# Patient Record
Sex: Female | Born: 2000
Health system: Southern US, Community
[De-identification: ages and names within clinical notes are randomized; demographics above are authoritative.]

## PROBLEM LIST (undated history)

## (undated) DIAGNOSIS — T7840XA Allergy, unspecified, initial encounter: Secondary | ICD-10-CM

## (undated) HISTORY — DX: Allergy, unspecified, initial encounter: T78.40XA

## (undated) HISTORY — PX: TONSILLECTOMY: SUR1361

---
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
## 2022-01-29 DIAGNOSIS — Z309 Encounter for contraceptive management, unspecified: Secondary | ICD-10-CM | POA: Diagnosis not present

## 2022-07-20 DIAGNOSIS — Z3009 Encounter for other general counseling and advice on contraception: Secondary | ICD-10-CM | POA: Diagnosis not present

## 2022-11-04 DIAGNOSIS — Z118 Encounter for screening for other infectious and parasitic diseases: Secondary | ICD-10-CM | POA: Diagnosis not present

## 2022-11-04 DIAGNOSIS — Z01419 Encounter for gynecological examination (general) (routine) without abnormal findings: Secondary | ICD-10-CM | POA: Diagnosis not present

## 2023-05-23 ENCOUNTER — Other Ambulatory Visit: Payer: Self-pay

## 2023-05-23 ENCOUNTER — Emergency Department (HOSPITAL_BASED_OUTPATIENT_CLINIC_OR_DEPARTMENT_OTHER)
Admission: EM | Admit: 2023-05-23 | Discharge: 2023-05-23 | Disposition: A | Discharge status: Home / Self Care | Payer: Federal, State, Local not specified - PPO

## 2023-05-23 DIAGNOSIS — R5383 Other fatigue: Secondary | ICD-10-CM | POA: Insufficient documentation

## 2023-05-23 DIAGNOSIS — M79605 Pain in left leg: Secondary | ICD-10-CM | POA: Diagnosis not present

## 2023-05-23 DIAGNOSIS — M79604 Pain in right leg: Secondary | ICD-10-CM | POA: Diagnosis not present

## 2023-05-23 DIAGNOSIS — M79661 Pain in right lower leg: Secondary | ICD-10-CM | POA: Insufficient documentation

## 2023-05-23 DIAGNOSIS — M79662 Pain in left lower leg: Secondary | ICD-10-CM | POA: Insufficient documentation

## 2023-05-23 LAB — CBC WITH DIFFERENTIAL/PLATELET
Abs Immature Granulocytes: 0.03 10*3/uL (ref 0.00–0.07)
Basophils Absolute: 0 10*3/uL (ref 0.0–0.1)
Basophils Relative: 0 %
Eosinophils Absolute: 0.1 10*3/uL (ref 0.0–0.5)
Eosinophils Relative: 1 %
HCT: 37.8 % (ref 36.0–46.0)
Hemoglobin: 12.8 g/dL (ref 12.0–15.0)
Immature Granulocytes: 0 %
Lymphocytes Relative: 32 %
Lymphs Abs: 2.7 10*3/uL (ref 0.7–4.0)
MCH: 30.3 pg (ref 26.0–34.0)
MCHC: 33.9 g/dL (ref 30.0–36.0)
MCV: 89.4 fL (ref 80.0–100.0)
Monocytes Absolute: 0.6 10*3/uL (ref 0.1–1.0)
Monocytes Relative: 7 %
Neutro Abs: 5.1 10*3/uL (ref 1.7–7.7)
Neutrophils Relative %: 60 %
Platelets: 297 10*3/uL (ref 150–400)
RBC: 4.23 MIL/uL (ref 3.87–5.11)
RDW: 11.9 % (ref 11.5–15.5)
WBC: 8.5 10*3/uL (ref 4.0–10.5)
nRBC: 0 % (ref 0.0–0.2)

## 2023-05-23 LAB — COMPREHENSIVE METABOLIC PANEL
ALT: 7 U/L (ref 0–44)
AST: 14 U/L — ABNORMAL LOW (ref 15–41)
Albumin: 4.3 g/dL (ref 3.5–5.0)
Alkaline Phosphatase: 50 U/L (ref 38–126)
Anion gap: 7 (ref 5–15)
BUN: 9 mg/dL (ref 6–20)
CO2: 28 mmol/L (ref 22–32)
Calcium: 9.7 mg/dL (ref 8.9–10.3)
Chloride: 103 mmol/L (ref 98–111)
Creatinine, Ser: 0.67 mg/dL (ref 0.44–1.00)
GFR, Estimated: >60 mL/min (ref >60)
Glucose, Bld: 86 mg/dL (ref 70–99)
Potassium: 3.7 mmol/L (ref 3.5–5.1)
Sodium: 138 mmol/L (ref 135–145)
Total Bilirubin: 0.5 mg/dL (ref <1.2)
Total Protein: 7 g/dL (ref 6.5–8.1)

## 2023-05-23 MED ORDER — PREDNISONE 10 MG (21) PO TBPK: ORAL_TABLET | Freq: Every day | ORAL | 0 refills | Status: DC

## 2023-05-23 MED ORDER — ACETAMINOPHEN 500 MG PO TABS
1000.0000 mg | ORAL_TABLET | Freq: Once | ORAL | Status: AC
  Administered 2023-05-23: 1000 mg via ORAL
  Filled 2023-05-23: qty 2

## 2023-05-23 MED ORDER — IBUPROFEN 400 MG PO TABS
400.0000 mg | ORAL_TABLET | Freq: Once | ORAL | Status: AC
  Administered 2023-05-23: 400 mg via ORAL
  Filled 2023-05-23: qty 1

## 2023-05-23 NOTE — ED Triage Notes (Signed)
C/o bilateral leg pain. States she has knots and bruises on both legs x 1 weeks. Areas are red and swollen.

## 2023-05-23 NOTE — Discharge Instructions (Addendum)
Your labs were normal. Take steroids that were prescribed you.  Please follow-up with your primary doctor and see a rheumatologist.  May take over-the-counter Tylenol alternate with Motrin for pain.

## 2023-05-23 NOTE — ED Notes (Signed)
Pt requesting to speak with provider, EDP notified

## 2023-05-23 NOTE — ED Provider Notes (Signed)
[place] EMERGENCY DEPARTMENT AT [facility] Provider Note   CSN: [id number] Arrival date & time: 05/23/23  1334     History  Chief Complaint  Patient presents with   Leg Pain    [name] [name] is a 22 y.o. female.  This is a 22 year old female presenting emergency department for bilateral lumps and painful areas on her legs for the past week.  No inciting event or trauma.  She states areas seemingly popped up and are painful to touch.  Has some pain with walking.  No fevers, weight loss or weight gain.  No generalized weakness.  Does endorse some fatigue.  Denies any URI symptoms.  No chest pain no abdominal pain.  No UTIs, normal bowel movements.   Leg Pain      Home Medications Prior to Admission medications   Medication Sig Start Date End Date Taking? Authorizing Provider  albuterol (VENTOLIN HFA) 108 (90 Base) MCG/ACT inhaler Inhale into the lungs. 02/29/20   [provider]  fluticasone Aleda Grana) 50 MCG/ACT nasal spray  10/31/12   [provider]  loratadine (CLARITIN) 10 MG tablet Take 10 mg by mouth daily.    [provider]  methocarbamol (ROBAXIN) 500 MG tablet Take 1 tablet (500 mg total) by mouth every 8 (eight) hours as needed for muscle spasms. 04/24/21   Benjiman Core, MD      Allergies    Prunus persica    Review of Systems   Review of Systems  Physical Exam Updated Vital Signs BP 115/76   Pulse 66   Temp 98 F (36.7 C) (Oral)   Resp 18   Ht 5\' 2"  (1.575 m)   Wt 56.7 kg   SpO2 100%   BMI 22.86 kg/m  Physical Exam Vitals and nursing note reviewed.  Constitutional:      General: She is not in acute distress.    Appearance: She is not toxic-appearing.  HENT:     Head: Normocephalic.     Nose: Nose normal.     Mouth/Throat:     Mouth: Mucous membranes are moist.  Eyes:     Conjunctiva/sclera: Conjunctivae normal.  Cardiovascular:     Rate and Rhythm: Normal rate.     Pulses: Normal pulses.   Pulmonary:     Effort: Pulmonary effort is normal.  Abdominal:     General: Abdomen is flat. There is no distension.     Tenderness: There is no abdominal tenderness. There is no guarding or rebound.  Musculoskeletal:     Comments: Bilateral lower extremities warm well-perfused.  2+ DP pulse.  Soft compartments.  Has several areas of bruising and tenderness.  To bilateral anterior shins that are tender to touch, some mild redness but not warm/erythematous/fluctance.   Skin:    Capillary Refill: Capillary refill takes less than 2 seconds.  Neurological:     Mental Status: She is alert and oriented to person, place, and time.  Psychiatric:        Mood and Affect: Mood normal.        Behavior: Behavior normal.     ED Results / Procedures / Treatments   Labs (all labs ordered are listed, but only abnormal results are displayed) Labs Reviewed  COMPREHENSIVE METABOLIC PANEL - Abnormal; Notable for the following components:      Result Value   AST 14 (*)    All other components within normal limits  CBC WITH DIFFERENTIAL/PLATELET    EKG None  Radiology No results found.  Procedures Procedures    Medications Ordered in ED Medications  acetaminophen (TYLENOL) tablet 1,000 mg (1,000 mg Oral Given 05/23/23 1854)  ibuprofen (ADVIL) tablet 400 mg (400 mg Oral Given 05/23/23 1854)    ED Course/ Medical Decision Making/ A&P                                 Medical Decision Making Well-appearing 23 year old female present emergency department with bilateral leg pains.  Chest afebrile vital signs reassuring.  No constitutional symptoms.  CBC with no leukocytosis to suggest systemic infection.  No leukocytosis to suggest neoplastic process.  CMP with no electrolyte disturbances.  Normal renal function.  No transaminitis to suggest hepatitis/hepatobiliary disease.  Considered imaging of the legs, however exam and history not consistent with DVT.  Otherwise low risk.  Forego ultrasound.   Concern for possible autoimmune component given family history provided by mother of lupus.  Will give burst of steroids.  Given Tylenol Motrin here in the emergency department.  Stable for discharge to follow-up with PCP/rheumatology.  Amount and/or Complexity of Data Reviewed Labs: ordered.  Risk OTC drugs. Prescription drug management.          Final Clinical Impression(s) / ED Diagnoses Final diagnoses:  None    Rx / DC Orders ED Discharge Orders     None         Coral Spikes, DO 05/23/23 1942

## 2023-05-25 ENCOUNTER — Encounter: Payer: Self-pay | Admitting: Nurse Practitioner

## 2023-05-25 ENCOUNTER — Ambulatory Visit: Payer: Federal, State, Local not specified - PPO | Admitting: Nurse Practitioner

## 2023-05-25 VITALS — BP 80/60 | HR 78 | Temp 98.6°F | Ht 62.0 in | Wt 126.6 lb

## 2023-05-25 DIAGNOSIS — Z113 Encounter for screening for infections with a predominantly sexual mode of transmission: Secondary | ICD-10-CM | POA: Diagnosis not present

## 2023-05-25 DIAGNOSIS — R21 Rash and other nonspecific skin eruption: Secondary | ICD-10-CM

## 2023-05-25 DIAGNOSIS — R229 Localized swelling, mass and lump, unspecified: Secondary | ICD-10-CM | POA: Diagnosis not present

## 2023-05-25 DIAGNOSIS — Z2821 Immunization not carried out because of patient refusal: Secondary | ICD-10-CM

## 2023-05-25 DIAGNOSIS — Z7689 Persons encountering health services in other specified circumstances: Secondary | ICD-10-CM

## 2023-05-25 DIAGNOSIS — Z114 Encounter for screening for human immunodeficiency virus [HIV]: Secondary | ICD-10-CM | POA: Diagnosis not present

## 2023-05-25 DIAGNOSIS — T148XXA Other injury of unspecified body region, initial encounter: Secondary | ICD-10-CM

## 2023-05-25 DIAGNOSIS — Z1159 Encounter for screening for other viral diseases: Secondary | ICD-10-CM

## 2023-05-25 NOTE — Patient Instructions (Signed)
If you are not feeling better on Monday return call to office for antibiotics.  Be sure to take prednisone until completely gone

## 2023-05-25 NOTE — Progress Notes (Signed)
Madelaine Bhat, CMA,acting as a Neurosurgeon for Arnette Felts, FNP.,have documented all relevant documentation on the behalf of Arnette Felts, FNP,as directed by  Arnette Felts, FNP while in the presence of Arnette Felts, FNP.  Subjective:  Patient ID: [name] [name] , female    DOB: 02/05/2001 , 22 y.o.   MRN: [id number]  Chief Complaint  Patient presents with   Establish Care    HPI  Patient presents today to establish care, she was seeing Dr Marcial Pacas at [facility]. Patient reports compliance with medication. Patient denies any chest pain, SOB, or headaches. Patient reports she has knots and bruises on her lower legs. Patient reports they first started a few weeks ago, she reports they are very painful. She reports she notices her ankle are sometimes swollen as well, she reports them she gets up in the morning her legs hurt to walk. She did go to ER on 05/23/2023 for her legs and they advised she see a rheumatology. 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   Maternal grandmother - has CHF, arthritis unknown type 2 younger brothers both healthy Patient is established with a OBGYN - Runner, broadcasting/film/video.      Past Medical History:  Diagnosis Date   Allergy      Family History  Problem Relation Age of Onset   Hypertension Father    Irritable bowel syndrome Father    Intestinal polyp Father    Irritable bowel syndrome Paternal Grandmother    GER disease Paternal Grandmother    Irritable bowel syndrome Paternal Grandfather      Current Outpatient Medications:    albuterol (VENTOLIN HFA) 108 (90 Base) MCG/ACT inhaler, Inhale into the lungs., Disp: , Rfl:    loratadine (CLARITIN) 10 MG tablet, Take 10 mg by mouth daily., Disp:  , Rfl:    permethrin (ELIMITE) 5 % cream, Apply 1 Application topically once., Disp: , Rfl:    predniSONE (STERAPRED UNI-PAK 21 TAB) 10 MG (21) TBPK tablet, Take by mouth daily. Take 6 tabs by mouth daily  for 2 days, then 5 tabs for 2 days, then 4 tabs for 2 days, then 3 tabs for 2 days, 2 tabs for 2 days, then 1 tab by mouth daily for 2 days, Disp: 42 tablet, Rfl: 0   tacrolimus (PROTOPIC) 0.1 % ointment, Apply topically 2 (two) times daily., Disp: , Rfl:    TRI-LO-MARZIA 0.18/0.215/0.25 MG-25 MCG TABS, Take 1 tablet by mouth daily., Disp: , Rfl:    triamcinolone cream (KENALOG) 0.1 %, Apply 1 Application topically 2 (two) times daily., Disp: , Rfl:    fluticasone (FLONASE) 50 MCG/ACT nasal spray, , Disp: , Rfl:    methocarbamol (ROBAXIN) 500 MG tablet, Take 1 tablet (500 mg total) by mouth every 8 (eight) hours as needed for muscle spasms. (Patient not taking: Reported on 05/25/2023), Disp: 8 tablet, Rfl: 0   Allergies  Allergen Reactions   Cherry     Throat swelling   Prunus Persica Hives     Review of Systems  Constitutional:  Positive for fatigue.  Respiratory: Negative.    Cardiovascular: Negative.   Endocrine: Negative for polydipsia, polyphagia and polyuria.  Musculoskeletal:  Negative for arthralgias, joint swelling and  neck stiffness.  Skin:        "Knots on her legs bilaterally"  Neurological: Negative.   Psychiatric/Behavioral: Negative.       Today's Vitals   05/25/23 1137  BP: (!) 80/60  Pulse: 78  Temp: 98.6 F (37 C)  TempSrc: Oral  Weight: 126 lb 9.6 oz (57.4 kg)  Height: 5\' 2"  (1.575 m)  PainSc: 5   PainLoc: Leg   Body mass index is 23.16 kg/m.  Wt Readings from Last 3 Encounters:  05/25/23 126 lb 9.6 oz (57.4 kg)  05/23/23 125 lb (56.7 kg)  04/24/21 117 lb (53.1 kg) (32%, Z= -0.47)*   * Growth percentiles are based on CDC (Girls, 2-20 Years) data.      Objective:  Physical Exam Vitals reviewed.  Constitutional:      Appearance: She is  well-developed.  HENT:     Head: Normocephalic and atraumatic.  Eyes:     Pupils: Pupils are equal, round, and reactive to light.  Cardiovascular:     Rate and Rhythm: Normal rate and regular rhythm.     Pulses: Normal pulses.     Heart sounds: Normal heart sounds. No murmur heard. Pulmonary:     Effort: Pulmonary effort is normal.     Breath sounds: Normal breath sounds.  Musculoskeletal:        General: Tenderness (bilateral lower extremities) present. Normal range of motion.  Skin:    General: Skin is warm and dry.     Capillary Refill: Capillary refill takes less than 2 seconds.     Findings: Bruising, erythema and rash (bilateral lower extremities) present.  Neurological:     General: No focal deficit present.     Mental Status: She is alert and oriented to person, place, and time.     Cranial Nerves: No cranial nerve deficit.     Motor: No weakness.  Psychiatric:        Mood and Affect: Mood normal.        Behavior: Behavior normal.        Thought Content: Thought content normal.        Judgment: Judgment normal.         Assessment And Plan:  Establishing care with new doctor, encounter for Assessment & Plan: Patient is here to establish care. Went over patient medical, family, social and surgical history. Reviewed with patient their medications and any allergies  Reviewed with patient their sexual orientation, drug/tobacco and alcohol use Dicussed any new concerns with patient  recommended patient comes in for a physical exam and complete blood work.  Educated patient about the importance of annual screenings and immunizations.  Advised patient to eat a healthy diet along with exercise for atleast 30-45 min atleast 4-5 days of the week.     Bruising Assessment & Plan: She has mild bruising to her lower extremities bilaterally.  Orders: -     ANA, IFA (with reflex) -     CYCLIC CITRUL PEPTIDE ANTIBODY, IGG/IGA -     Rheumatoid factor -     Sedimentation  rate -     Uric acid  Lumps on the skin Assessment & Plan: Palpable lumps present to bilateral lower extremities, slightly tender.   Rash and nonspecific skin eruption Assessment & Plan: Erythematous macular rash to lower extremities, splotchy in some areas.  She is to continue taking the steroid if symptoms do not improve by Monday return call to office.  Consulted with Dr. Allyne Gee concerned this may  be erythema nordosum. Will consider referral to Rheumatology pending labs and if she does not improve  Orders: -     ANA, IFA (with reflex) -     CYCLIC CITRUL PEPTIDE ANTIBODY, IGG/IGA -     Rheumatoid factor -     Sedimentation rate -     Uric acid  COVID-19 vaccination declined Assessment & Plan: Declines covid 19 vaccine. Discussed risk of covid 14 and if she changes her mind about the vaccine to call the office. Education has been provided regarding the importance of this vaccine but patient still declined. Advised may receive this vaccine at local pharmacy or Health Dept.or vaccine clinic. Aware to provide a copy of the vaccination record if obtained from local pharmacy or Health Dept.  Encouraged to take multivitamin, vitamin d, vitamin c and zinc to increase immune system. Aware can call office if would like to have vaccine here at office. Verbalized acceptance and understanding.    Influenza vaccination declined Assessment & Plan: Patient declined influenza vaccination at this time. Patient is aware that influenza vaccine prevents illness in 70% of healthy people, and reduces hospitalizations to 30-70% in elderly. This vaccine is recommended annually. Education has been provided regarding the importance of this vaccine but patient still declined. Advised may receive this vaccine at local pharmacy or Health Dept.or vaccine clinic. Aware to provide a copy of the vaccination record if obtained from local pharmacy or Health Dept.  Pt is willing to accept risk associated with refusing  vaccination.    Encounter for hepatitis C screening test for low risk patient Assessment & Plan: Will check Hepatitis C screening due to recent recommendations to screen all adults 18 years and older    Orders: -     Hepatitis C antibody  Encounter for HIV (human immunodeficiency virus) test -     HIV Antibody (routine testing w rflx)  Screening for STDs (sexually transmitted diseases) -     Chlamydia/Gonococcus/Trichomonas, NAA    No follow-ups on file.  Patient was given opportunity to ask questions. Patient verbalized understanding of the plan and was able to repeat key elements of the plan. All questions were answered to their satisfaction.    Jeanell Sparrow, FNP, have reviewed all documentation for this visit. The documentation on 06/01/23 for the exam, diagnosis, procedures, and orders are all accurate and complete.   IF YOU HAVE BEEN REFERRED TO A SPECIALIST, IT MAY TAKE 1-2 WEEKS TO SCHEDULE/PROCESS THE REFERRAL. IF YOU HAVE NOT HEARD FROM US/SPECIALIST IN TWO WEEKS, PLEASE GIVE [place] A CALL AT [phone].

## 2023-05-27 LAB — CHLAMYDIA/GONOCOCCUS/TRICHOMONAS, NAA
Chlamydia by NAA: NEGATIVE
Gonococcus by NAA: NEGATIVE
Trich vag by NAA: NEGATIVE

## 2023-05-28 LAB — ANTINUCLEAR ANTIBODIES, IFA: ANA Titer 1: NEGATIVE

## 2023-05-28 LAB — URIC ACID: Uric Acid: 3.6 mg/dL (ref 2.6–6.2)

## 2023-05-28 LAB — HIV ANTIBODY (ROUTINE TESTING W REFLEX): HIV Screen 4th Generation wRfx: NONREACTIVE

## 2023-05-28 LAB — SEDIMENTATION RATE: Sed Rate: 2 mm/h (ref 0–32)

## 2023-05-28 LAB — HEPATITIS C ANTIBODY: Hep C Virus Ab: NONREACTIVE

## 2023-05-28 LAB — RHEUMATOID FACTOR: Rheumatoid fact SerPl-aCnc: <10 [IU]/mL (ref <14.0)

## 2023-05-28 LAB — CYCLIC CITRUL PEPTIDE ANTIBODY, IGG/IGA: Cyclic Citrullin Peptide Ab: 6 U (ref 0–19)

## 2023-05-31 ENCOUNTER — Encounter: Payer: Self-pay | Admitting: Nurse Practitioner

## 2023-06-01 DIAGNOSIS — R229 Localized swelling, mass and lump, unspecified: Secondary | ICD-10-CM | POA: Insufficient documentation

## 2023-06-01 DIAGNOSIS — Z1159 Encounter for screening for other viral diseases: Secondary | ICD-10-CM | POA: Insufficient documentation

## 2023-06-01 DIAGNOSIS — Z7689 Persons encountering health services in other specified circumstances: Secondary | ICD-10-CM | POA: Insufficient documentation

## 2023-06-01 DIAGNOSIS — R21 Rash and other nonspecific skin eruption: Secondary | ICD-10-CM | POA: Insufficient documentation

## 2023-06-01 DIAGNOSIS — T148XXA Other injury of unspecified body region, initial encounter: Secondary | ICD-10-CM | POA: Insufficient documentation

## 2023-06-01 DIAGNOSIS — Z2821 Immunization not carried out because of patient refusal: Secondary | ICD-10-CM | POA: Insufficient documentation

## 2023-06-01 NOTE — Assessment & Plan Note (Signed)
Will check Hepatitis C screening due to recent recommendations to screen all adults 18 years and older

## 2023-06-01 NOTE — Assessment & Plan Note (Addendum)
Erythematous macular rash to lower extremities, splotchy in some areas.  She is to continue taking the steroid if symptoms do not improve by Monday return call to office.  Consulted with Dr. Allyne Gee concerned this may be erythema nordosum. Will consider referral to Rheumatology pending labs and if she does not improve

## 2023-06-01 NOTE — Assessment & Plan Note (Signed)
She has mild bruising to her lower extremities bilaterally.

## 2023-06-01 NOTE — Assessment & Plan Note (Signed)

## 2023-06-01 NOTE — Assessment & Plan Note (Signed)

## 2023-06-01 NOTE — Assessment & Plan Note (Signed)
Palpable lumps present to bilateral lower extremities, slightly tender.

## 2023-06-01 NOTE — Assessment & Plan Note (Signed)

## 2023-08-03 ENCOUNTER — Encounter: Payer: Federal, State, Local not specified - PPO | Admitting: Nurse Practitioner

## 2023-12-19 NOTE — Progress Notes (Unsigned)
 [name], CMA,acting as a Neurosurgeon for [name], FNP.,have documented all relevant documentation on the behalf of [name], FNP,as directed by  [name], FNP while in the presence of [name], FNP.  Subjective:  Patient ID: [name] [name] , female    DOB: [birth year] , 23 y.o.   MRN: [id number]  Chief Complaint  Patient presents with   Mass    Patient presents today for knots on her right leg, patient reports she has been having this problem on and off for a while. Patient reports she took prednisone  in the past for this problem and it got better, she reports it is now coming back on her right leg.    Acne    Patient reports she sometimes gets red, itchy bumps on her face on and off. She reports her face feels very dry as well. She noticed it more in the winter but now they are present in the summer.     HPI  The masses have started back up in the last few weeks, the pain is not as bad as before. There is an area on her right leg. No redness. Denies fever. No numbness or tingling. She is also having a red itchy rash to her forehead and left cheek.   She does not have her tonsils. Unsure of cause.      Past Medical History:  Diagnosis Date   Allergy      Family History  Problem Relation Age of Onset   Hypertension Father    Irritable bowel syndrome Father    Intestinal polyp Father    Irritable bowel syndrome Paternal Grandmother    GER disease Paternal Grandmother    Irritable bowel syndrome Paternal Grandfather      Current Outpatient Medications:    albuterol (VENTOLIN HFA) 108 (90 Base) MCG/ACT inhaler, Inhale into the lungs., Disp: , Rfl:    fluticasone (FLONASE) 50 MCG/ACT nasal spray, , Disp: , Rfl:    loratadine (CLARITIN) 10 MG tablet, Take 10 mg by mouth daily. (Patient not taking: Reported on 12/20/2023), Disp: , Rfl:    methocarbamol  (ROBAXIN ) 500 MG tablet, Take 1 tablet (500 mg total) by mouth every 8 (eight) hours as needed for muscle spasms.  (Patient not taking: Reported on 12/20/2023), Disp: 8 tablet, Rfl: 0   permethrin (ELIMITE) 5 % cream, Apply 1 Application topically once. (Patient not taking: Reported on 12/20/2023), Disp: , Rfl:    predniSONE  (STERAPRED UNI-PAK 21 TAB) 10 MG (21) TBPK tablet, Take by mouth daily. Take 6 tabs by mouth daily  for 2 days, then 5 tabs for 2 days, then 4 tabs for 2 days, then 3 tabs for 2 days, 2 tabs for 2 days, then 1 tab by mouth daily for 2 days (Patient not taking: Reported on 12/20/2023), Disp: 42 tablet, Rfl: 0   tacrolimus (PROTOPIC) 0.1 % ointment, Apply topically 2 (two) times daily. (Patient not taking: Reported on 12/20/2023), Disp: , Rfl:    TRI-LO-MARZIA 0.18/0.215/0.25 MG-25 MCG TABS, Take 1 tablet by mouth daily. (Patient not taking: Reported on 12/20/2023), Disp: , Rfl:    triamcinolone cream (KENALOG) 0.1 %, Apply 1 Application topically 2 (two) times daily. (Patient not taking: Reported on 12/20/2023), Disp: , Rfl:    Allergies  Allergen Reactions   Cherry     Throat swelling   Prunus Persica Hives     Review of Systems   Today's Vitals   12/20/23 1032  BP: 120/60  Pulse: 85  Temp: 99.5 F (37.5 C)  TempSrc: Oral  Weight: 140 lb 9.6 oz (63.8 kg)  Height: 5' 2 (1.575 m)  PainSc: 0-No pain   Body mass index is 25.72 kg/m.  Wt Readings from Last 3 Encounters:  12/20/23 140 lb 9.6 oz (63.8 kg)  05/25/23 126 lb 9.6 oz (57.4 kg)  05/23/23 125 lb (56.7 kg)    The ASCVD Risk score (Arnett DK, et al., 2019) failed to calculate for the following reasons:   The 2019 ASCVD risk score is only valid for ages 56 to 26  Objective:  Physical Exam      Assessment And Plan:  Lumps on the skin -     DG Chest 2 View; Future -     QuantiFERON-TB Gold Plus -     C-reactive protein -     Antistreptolysin O titer  Facial rash -     Ambulatory referral to Dermatology  COVID-19 vaccination declined  Overweight with body mass index (BMI) of 25 to 25.9 in adult  Encounter for  gynecological examination -     Ambulatory referral to Obstetrics / Gynecology    Return in about 5 months (around 05/21/2024) for phy when able. .  Patient was given opportunity to ask questions. Patient verbalized understanding of the plan and was able to repeat key elements of the plan. All questions were answered to their satisfaction.    LILLETTE [name], FNP, have reviewed all documentation for this visit. The documentation on 12/20/23 for the exam, diagnosis, procedures, and orders are all accurate and complete.   IF YOU HAVE BEEN REFERRED TO A SPECIALIST, IT MAY TAKE 1-2 WEEKS TO SCHEDULE/PROCESS THE REFERRAL. IF YOU HAVE NOT HEARD FROM US /SPECIALIST IN TWO WEEKS, PLEASE GIVE US  A CALL AT [phone].

## 2023-12-20 ENCOUNTER — Encounter: Payer: Self-pay | Admitting: Nurse Practitioner

## 2023-12-20 ENCOUNTER — Ambulatory Visit: Admitting: Nurse Practitioner

## 2023-12-20 VITALS — BP 120/60 | HR 85 | Temp 99.5°F | Ht 62.0 in | Wt 140.6 lb

## 2023-12-20 DIAGNOSIS — E663 Overweight: Secondary | ICD-10-CM

## 2023-12-20 DIAGNOSIS — Z6825 Body mass index (BMI) 25.0-25.9, adult: Secondary | ICD-10-CM

## 2023-12-20 DIAGNOSIS — R229 Localized swelling, mass and lump, unspecified: Secondary | ICD-10-CM | POA: Diagnosis not present

## 2023-12-20 DIAGNOSIS — Z2821 Immunization not carried out because of patient refusal: Secondary | ICD-10-CM

## 2023-12-20 DIAGNOSIS — Z01419 Encounter for gynecological examination (general) (routine) without abnormal findings: Secondary | ICD-10-CM

## 2023-12-20 DIAGNOSIS — R21 Rash and other nonspecific skin eruption: Secondary | ICD-10-CM

## 2023-12-20 LAB — POCT RAPID STREP A (OFFICE): Rapid Strep A Screen: NEGATIVE

## 2023-12-24 LAB — QUANTIFERON-TB GOLD PLUS
QuantiFERON Mitogen Value: >10 [IU]/mL
QuantiFERON Nil Value: 0.05 [IU]/mL
QuantiFERON TB1 Ag Value: 0.05 [IU]/mL
QuantiFERON TB2 Ag Value: 0.06 [IU]/mL
QuantiFERON-TB Gold Plus: NEGATIVE

## 2023-12-24 LAB — ANTISTREPTOLYSIN O TITER: ASO: <20 [IU]/mL (ref 0.0–200.0)

## 2023-12-24 LAB — C-REACTIVE PROTEIN: CRP: <1 mg/L (ref 0–10)

## 2023-12-26 ENCOUNTER — Ambulatory Visit: Payer: Self-pay | Admitting: Nurse Practitioner

## 2023-12-28 NOTE — Assessment & Plan Note (Signed)

## 2023-12-28 NOTE — Assessment & Plan Note (Signed)
 Reports having acne type rash to face, will refer to dermatology for further evaluation.

## 2023-12-28 NOTE — Assessment & Plan Note (Signed)
 Palpable lump present to left lower extremities, slightly tender. The other lumps are improved after being treated with a steroid taper. Since reoccurring will order additional labs. Negative rapid strep. Her autoimmune labs are negative. Consulted with Dr. Jarold and Ddx includes erythema nordosm.

## 2024-01-10 DIAGNOSIS — R300 Dysuria: Secondary | ICD-10-CM | POA: Diagnosis not present

## 2024-01-10 DIAGNOSIS — R350 Frequency of micturition: Secondary | ICD-10-CM | POA: Diagnosis not present

## 2024-03-15 ENCOUNTER — Encounter: Admitting: Obstetrics & Gynecology

## 2024-05-22 ENCOUNTER — Encounter: Payer: Self-pay | Admitting: Nurse Practitioner

## 2024-05-22 ENCOUNTER — Ambulatory Visit (INDEPENDENT_AMBULATORY_CARE_PROVIDER_SITE_OTHER): Payer: Self-pay | Admitting: Nurse Practitioner

## 2024-05-22 VITALS — BP 110/60 | HR 90 | Temp 98.9°F | Ht 62.0 in | Wt 138.0 lb

## 2024-05-22 DIAGNOSIS — Z13220 Encounter for screening for lipoid disorders: Secondary | ICD-10-CM | POA: Diagnosis not present

## 2024-05-22 DIAGNOSIS — Z114 Encounter for screening for human immunodeficiency virus [HIV]: Secondary | ICD-10-CM

## 2024-05-22 DIAGNOSIS — Z139 Encounter for screening, unspecified: Secondary | ICD-10-CM

## 2024-05-22 DIAGNOSIS — Z0000 Encounter for general adult medical examination without abnormal findings: Secondary | ICD-10-CM | POA: Diagnosis not present

## 2024-05-22 DIAGNOSIS — Z13228 Encounter for screening for other metabolic disorders: Secondary | ICD-10-CM

## 2024-05-22 DIAGNOSIS — Z136 Encounter for screening for cardiovascular disorders: Secondary | ICD-10-CM | POA: Diagnosis not present

## 2024-05-22 DIAGNOSIS — N644 Mastodynia: Secondary | ICD-10-CM

## 2024-05-22 DIAGNOSIS — R21 Rash and other nonspecific skin eruption: Secondary | ICD-10-CM

## 2024-05-22 DIAGNOSIS — Z113 Encounter for screening for infections with a predominantly sexual mode of transmission: Secondary | ICD-10-CM

## 2024-05-22 DIAGNOSIS — Z2821 Immunization not carried out because of patient refusal: Secondary | ICD-10-CM

## 2024-05-22 NOTE — Patient Instructions (Signed)
 Health Maintenance  Topic Date Due   HPV Vaccine (1 - 3-dose series) Never done   Hepatitis B Vaccine (1 of 3 - 19+ 3-dose series) Never done   Pap Smear  Never done   COVID-19 Vaccine (3 - 2025-26 season) 06/07/2024*   Flu Shot  10/02/2024*   DTaP/Tdap/Td vaccine (2 - Td or Tdap) 05/22/2025*   Meningitis B Vaccine (1 of 2 - Standard) 05/22/2025*   Hepatitis C Screening  Completed   HIV Screening  Completed   Pneumococcal Vaccine  Aged Out  *Topic was postponed. The date shown is not the original due date.    VISIT SUMMARY: Today, you came in for your annual physical exam. We discussed your exercise and dietary habits, and you mentioned some concerns about your skin and breast tenderness. We also talked about your plans to increase your exercise routine and improve your eating habits. Additionally, we addressed your request for STI screening and the need a new referral to a dermatologist.  YOUR PLAN: -ADULT WELLNESS VISIT: This visit was a routine check-up to monitor your overall health. Your blood pressure is stable, and you have lost some weight. We discussed the importance of regular exercise and healthy eating habits. You should aim for at least 150 minutes of exercise per week and try to avoid eating only once a day. Healthier snacks are recommended. Your next annual physical is scheduled for next year.  -SCREENING FOR SEXUALLY TRANSMITTED INFECTIONS: You requested screening for sexually transmitted infections (STIs). We have ordered the necessary tests to check for any infections.  -RIGHT BREAST TENDERNESS, POSSIBLE CYST OR DENSE TISSUE: You have tenderness in your right breast, which could be due to a cyst or dense tissue. This tenderness is not necessarily a sign of cancer. We have ordered a breast ultrasound to investigate further. Please follow up with the breast center to schedule this ultrasound.   INSTRUCTIONS: Please follow up with the breast center to schedule your  ultrasound. Contact Dr. Melonie office for your dermatology appointment and follow up with them if you do not hear back within a week. Your next annual physical is scheduled for next year.   Contains text generated by [employer].

## 2024-05-22 NOTE — Progress Notes (Signed)
 [name], CMA,acting as a neurosurgeon for [name], FNP.,have documented all relevant documentation on the behalf of [name], FNP,as directed by  [name], FNP while in the presence of [name], FNP.  Subjective:    Patient ID: [name] [name] , female    DOB: Nov 28, 2000 , 23 y.o.   MRN: [id number]  Chief Complaint  Patient presents with   Annual Exam    Patient presents today for HM, Patient reports compliance with medication. Patient denies any chest pain, SOB, or headaches. Patient states she has to reschedule her pap at her OBGYN office.    Rash    Patient reports she is still having redness and puffiness in her face from time to time.     [facility] her previous provider is no longer there.  She is planning to call them for an appt.     Discussed the use of AI scribe software for clinical note transcription with the patient, who gave verbal consent to proceed.  History of Present Illness [name] [name] is a 23 year old female who presents for an annual physical exam.  She is experiencing a skin issue and is seeking a referral to a specialist. The initial referral was unsuccessful as the office was not accepting new patients, and efforts are ongoing to find an alternative specialist.  She exercises inconsistently, about two times a week, with plans to increase to five times a week upon returning to the gym. Her eating habits are irregular, sometimes eating only once a day and other times more frequently. She is a holiday representative in college, glass blower/designer in journalism and [employer], and plans to pursue a master's in sports administration while working full-time.  She stopped taking birth control due to inconsistency and side effects, including sickness. Initially, she was on birth control for cramps, which have since improved. She plans to contact her OBGYN for a Pap smear by the end of the year.  No chest pain, shortness of breath, or swelling in feet or ankles. She  reports tenderness in her breast and notes that she sometimes wears wider bras. There is no family history of breast issues, and she does not regularly perform breast self-exams.  Past Medical History:  Diagnosis Date   Allergy      Family History  Problem Relation Age of Onset   Hypertension Father    Irritable bowel syndrome Father    Intestinal polyp Father    Irritable bowel syndrome Paternal Grandmother    GER disease Paternal Grandmother    Irritable bowel syndrome Paternal Grandfather      Current Outpatient Medications:    albuterol (VENTOLIN HFA) 108 (90 Base) MCG/ACT inhaler, Inhale into the lungs., Disp: , Rfl:    Allergies  Allergen Reactions   Cherry     Throat swelling   Prunus Persica Hives     The patient states she uses none for birth control. Patient's last menstrual period was 05/01/2024 (approximate).. Negative for Dysmenorrhea and Negative for Menorrhagia. Negative for: breast discharge, breast lump(s), breast pain and breast self exam. Associated symptoms include abnormal vaginal bleeding. Pertinent negatives include abnormal bleeding (hematology), anxiety, decreased libido, depression, difficulty falling sleep, dyspareunia, history of infertility, nocturia, sexual dysfunction, sleep disturbances, urinary incontinence, urinary urgency, vaginal discharge and vaginal itching. Diet regular.The patient states her exercise level is minimal about 2 days a week.   The patient's tobacco use is:  Social History   Tobacco Use  Smoking Status Never  Smokeless Tobacco  Never   She has been exposed to passive smoke. The patient's alcohol use is:  Social History   Substance and Sexual Activity  Alcohol Use Yes   Comment: occ    Review of Systems  Constitutional: Negative.   HENT: Negative.    Eyes: Negative.   Respiratory: Negative.    Cardiovascular: Negative.   Gastrointestinal: Negative.   Endocrine: Negative.   Genitourinary: Negative.    Musculoskeletal: Negative.   Skin: Negative.   Allergic/Immunologic: Negative.   Neurological: Negative.   Hematological: Negative.   Psychiatric/Behavioral: Negative.       Today's Vitals   05/22/24 1034  BP: 110/60  Pulse: 90  Temp: 98.9 F (37.2 C)  TempSrc: Oral  Weight: 138 lb (62.6 kg)  Height: 5' 2 (1.575 m)  PainSc: 0-No pain   Body mass index is 25.24 kg/m.  Wt Readings from Last 3 Encounters:  05/22/24 138 lb (62.6 kg)  12/20/23 140 lb 9.6 oz (63.8 kg)  05/25/23 126 lb 9.6 oz (57.4 kg)     Objective:  Physical Exam Constitutional:      General: She is not in acute distress.    Appearance: Normal appearance. She is well-developed.  HENT:     Head: Normocephalic and atraumatic.     Right Ear: Hearing, tympanic membrane, ear canal and external ear normal. There is no impacted cerumen.     Left Ear: Hearing, tympanic membrane, ear canal and external ear normal. There is no impacted cerumen.     Nose: Nose normal.     Mouth/Throat:     Mouth: Mucous membranes are moist.  Eyes:     General: Lids are normal.     Extraocular Movements: Extraocular movements intact.     Conjunctiva/sclera: Conjunctivae normal.     Pupils: Pupils are equal, round, and reactive to light.     Funduscopic exam:    Right eye: No papilledema.        Left eye: No papilledema.  Neck:     Thyroid: No thyroid mass.     Vascular: No carotid bruit.  Cardiovascular:     Rate and Rhythm: Normal rate and regular rhythm.     Pulses: Normal pulses.     Heart sounds: Normal heart sounds. No murmur heard. Pulmonary:     Effort: Pulmonary effort is normal. No respiratory distress.     Breath sounds: Normal breath sounds. No wheezing.  Chest:     Chest wall: No mass.  Breasts:    Tanner Score is 5.     Right: Normal. No mass or tenderness.     Left: Normal. No mass or tenderness.  Abdominal:     General: Abdomen is flat. Bowel sounds are normal. There is no distension.     Palpations:  Abdomen is soft.     Tenderness: There is no abdominal tenderness.  Genitourinary:    Rectum: Guaiac result negative.  Musculoskeletal:        General: No swelling. Normal range of motion.     Cervical back: Full passive range of motion without pain, normal range of motion and neck supple.     Right lower leg: No edema.     Left lower leg: No edema.  Lymphadenopathy:     Upper Body:     Right upper body: No supraclavicular, axillary or pectoral adenopathy.     Left upper body: No supraclavicular, axillary or pectoral adenopathy.  Skin:    General: Skin is warm and dry.  Capillary Refill: Capillary refill takes less than 2 seconds.  Neurological:     General: No focal deficit present.     Mental Status: She is alert and oriented to person, place, and time.     Cranial Nerves: No cranial nerve deficit.     Sensory: No sensory deficit.  Psychiatric:        Mood and Affect: Mood normal.        Behavior: Behavior normal.        Thought Content: Thought content normal.        Judgment: Judgment normal.         Assessment And Plan:     Encounter for screening -     Hepatitis B surface antibody,qualitative  Encounter for annual health examination Assessment & Plan: Routine wellness visit with stable blood pressure and weight decrease. Discussed exercise and dietary habits. - Encouraged exercise at least 150 minutes per week. - Advised on healthy eating habits, including avoiding eating once a day and opting for healthier snacks. - Scheduled next annual physical for next year.  Orders: -     CBC with Differential/Platelet -     CMP14+EGFR  Breast tenderness Assessment & Plan: Right breast tenderness with possible cyst or dense tissue. No family history of breast issues. Tenderness not necessarily indicative of malignancy. - Ordered breast ultrasound to evaluate tenderness. - Instructed to follow up with breast center for ultrasound scheduling.  Orders: -     US  LIMITED  ULTRASOUND INCLUDING AXILLA RIGHT BREAST; Future  Facial rash Assessment & Plan: Initial referral to dermatologist made, but office not accepting new patients. New referral to Dr. Melonie office initiated. - Provided contact information for Dr. Melonie office for dermatology appointment. - Advised to follow up with dermatology office if not contacted within a week.   Screening for STDs (sexually transmitted diseases) -     Chlamydia/Gonococcus/Trichomonas, NAA -     HSV 1 and 2 Ab, IgG -     RPR W/RFLX TO RPR TITER, TREPONEMAL AB, SCREEN AND DIAGNOSIS  Influenza vaccination declined  Tetanus, diphtheria, and acellular pertussis (Tdap) vaccination declined  Encounter for screening for metabolic disorder -     Hemoglobin A1c  Encounter for lipid screening for cardiovascular disease -     Lipid panel  Encounter for HIV (human immunodeficiency virus) test -     HIV Antibody (routine testing w rflx)    Return for 1 year physical. Patient was given opportunity to ask questions. Patient verbalized understanding of the plan and was able to repeat key elements of the plan. All questions were answered to their satisfaction.   [name], FNP  I, [name], FNP, have reviewed all documentation for this visit. The documentation on 05/22/24 for the exam, diagnosis, procedures, and orders are all accurate and complete.

## 2024-05-23 ENCOUNTER — Inpatient Hospital Stay
Admission: RE | Admit: 2024-05-23 | Discharge: 2024-05-23 | Attending: Nurse Practitioner | Admitting: Nurse Practitioner

## 2024-05-23 DIAGNOSIS — L239 Allergic contact dermatitis, unspecified cause: Secondary | ICD-10-CM | POA: Diagnosis not present

## 2024-05-23 DIAGNOSIS — N644 Mastodynia: Secondary | ICD-10-CM

## 2024-05-23 LAB — CBC WITH DIFFERENTIAL/PLATELET
Basophils Absolute: 0 x10E3/uL (ref 0.0–0.2)
Basos: 0 %
EOS (ABSOLUTE): 0.1 x10E3/uL (ref 0.0–0.4)
Eos: 2 %
Hematocrit: 41.9 % (ref 34.0–46.6)
Hemoglobin: 13.7 g/dL (ref 11.1–15.9)
Immature Grans (Abs): 0 x10E3/uL (ref 0.0–0.1)
Immature Granulocytes: 0 %
Lymphocytes Absolute: 2.3 x10E3/uL (ref 0.7–3.1)
Lymphs: 35 %
MCH: 30.5 pg (ref 26.6–33.0)
MCHC: 32.7 g/dL (ref 31.5–35.7)
MCV: 93 fL (ref 79–97)
Monocytes Absolute: 0.4 x10E3/uL (ref 0.1–0.9)
Monocytes: 6 %
Neutrophils Absolute: 3.8 x10E3/uL (ref 1.4–7.0)
Neutrophils: 57 %
Platelets: 276 x10E3/uL (ref 150–450)
RBC: 4.49 x10E6/uL (ref 3.77–5.28)
RDW: 12 % (ref 11.7–15.4)
WBC: 6.7 x10E3/uL (ref 3.4–10.8)

## 2024-05-23 LAB — HSV 1 AND 2 AB, IGG
HSV 1 Glycoprotein G Ab, IgG: NONREACTIVE
HSV 2 IgG, Type Spec: NONREACTIVE

## 2024-05-23 LAB — CMP14+EGFR
ALT: 8 IU/L (ref 0–32)
AST: 12 IU/L (ref 0–40)
Albumin: 4.3 g/dL (ref 4.0–5.0)
Alkaline Phosphatase: 62 IU/L (ref 41–116)
BUN/Creatinine Ratio: 11 (ref 9–23)
BUN: 8 mg/dL (ref 6–20)
Bilirubin Total: 0.4 mg/dL (ref 0.0–1.2)
CO2: 23 mmol/L (ref 20–29)
Calcium: 9 mg/dL (ref 8.7–10.2)
Chloride: 104 mmol/L (ref 96–106)
Creatinine, Ser: 0.73 mg/dL (ref 0.57–1.00)
Globulin, Total: 2.3 g/dL (ref 1.5–4.5)
Glucose: 80 mg/dL (ref 70–99)
Potassium: 4.4 mmol/L (ref 3.5–5.2)
Sodium: 139 mmol/L (ref 134–144)
Total Protein: 6.6 g/dL (ref 6.0–8.5)
eGFR: 120 mL/min/1.73 (ref >59)

## 2024-05-23 LAB — LIPID PANEL
Chol/HDL Ratio: 2.3 ratio (ref 0.0–4.4)
Cholesterol, Total: 176 mg/dL (ref 100–199)
HDL: 75 mg/dL (ref >39)
LDL Chol Calc (NIH): 92 mg/dL (ref 0–99)
Triglycerides: 42 mg/dL (ref 0–149)
VLDL Cholesterol Cal: 9 mg/dL (ref 5–40)

## 2024-05-23 LAB — HIV ANTIBODY (ROUTINE TESTING W REFLEX): HIV Screen 4th Generation wRfx: NONREACTIVE

## 2024-05-23 LAB — HEMOGLOBIN A1C
Est. average glucose Bld gHb Est-mCnc: 97 mg/dL
Hgb A1c MFr Bld: 5 % (ref 4.8–5.6)

## 2024-05-23 LAB — RPR: RPR Ser Ql: NONREACTIVE

## 2024-05-23 LAB — CHLAMYDIA/GONOCOCCUS/TRICHOMONAS, NAA
Chlamydia by NAA: NEGATIVE
Gonococcus by NAA: NEGATIVE
Trich vag by NAA: NEGATIVE

## 2024-05-23 LAB — HEPATITIS B SURFACE ANTIBODY,QUALITATIVE: Hep B Surface Ab, Qual: NONREACTIVE

## 2024-05-30 ENCOUNTER — Ambulatory Visit: Payer: Self-pay | Admitting: Nurse Practitioner

## 2024-05-30 DIAGNOSIS — N644 Mastodynia: Secondary | ICD-10-CM | POA: Insufficient documentation

## 2024-05-30 DIAGNOSIS — Z0000 Encounter for general adult medical examination without abnormal findings: Secondary | ICD-10-CM | POA: Insufficient documentation

## 2024-05-30 NOTE — Assessment & Plan Note (Signed)
 Initial referral to dermatologist made, but office not accepting new patients. New referral to Dr. Melonie office initiated. - Provided contact information for Dr. Melonie office for dermatology appointment. - Advised to follow up with dermatology office if not contacted within a week.

## 2024-05-30 NOTE — Assessment & Plan Note (Signed)
 Routine wellness visit with stable blood pressure and weight decrease. Discussed exercise and dietary habits. - Encouraged exercise at least 150 minutes per week. - Advised on healthy eating habits, including avoiding eating once a day and opting for healthier snacks. - Scheduled next annual physical for next year.

## 2024-05-30 NOTE — Assessment & Plan Note (Signed)
 Right breast tenderness with possible cyst or dense tissue. No family history of breast issues. Tenderness not necessarily indicative of malignancy. - Ordered breast ultrasound to evaluate tenderness. - Instructed to follow up with breast center for ultrasound scheduling.

## 2024-07-25 ENCOUNTER — Encounter: Payer: Self-pay | Admitting: Nurse Practitioner

## 2024-07-26 ENCOUNTER — Encounter: Payer: Self-pay | Admitting: Nurse Practitioner

## 2024-07-26 ENCOUNTER — Other Ambulatory Visit: Payer: Self-pay | Admitting: Nurse Practitioner

## 2024-07-26 DIAGNOSIS — R21 Rash and other nonspecific skin eruption: Secondary | ICD-10-CM

## 2025-04-10 ENCOUNTER — Ambulatory Visit: Admitting: Physician Assistant

## 2025-05-23 ENCOUNTER — Encounter: Admitting: Nurse Practitioner
